# Patient Record
Sex: Male | Born: 1985 | Hispanic: Yes | Marital: Single | Smoking: Never smoker
Health system: Southern US, Community
[De-identification: ages and names within clinical notes are randomized; demographics above are authoritative.]

## PROBLEM LIST (undated history)

## (undated) HISTORY — PX: NASAL SINUS SURGERY: SHX719

---

## 2009-03-15 ENCOUNTER — Emergency Department: Payer: Self-pay | Admitting: Emergency Medicine

## 2012-01-21 ENCOUNTER — Ambulatory Visit: Payer: Self-pay | Admitting: Otolaryngology

## 2013-10-19 ENCOUNTER — Ambulatory Visit: Payer: Self-pay | Admitting: Otolaryngology

## 2014-05-17 ENCOUNTER — Emergency Department
Admit: 2014-05-17 | Disposition: A | Discharge status: Home / Self Care | Payer: Self-pay | Admitting: Emergency Medicine

## 2015-02-26 DIAGNOSIS — M545 Low back pain: Secondary | ICD-10-CM | POA: Diagnosis not present

## 2015-02-26 DIAGNOSIS — K5900 Constipation, unspecified: Secondary | ICD-10-CM | POA: Diagnosis not present

## 2015-02-26 DIAGNOSIS — Z880 Allergy status to penicillin: Secondary | ICD-10-CM | POA: Insufficient documentation

## 2015-02-27 ENCOUNTER — Emergency Department
Admission: EM | Admit: 2015-02-27 | Discharge: 2015-02-27 | Disposition: A | Discharge status: Home / Self Care | Payer: Commercial Managed Care - PPO | Attending: Emergency Medicine | Admitting: Emergency Medicine

## 2015-02-27 ENCOUNTER — Emergency Department: Payer: Commercial Managed Care - PPO

## 2015-02-27 ENCOUNTER — Encounter: Payer: Self-pay | Admitting: Emergency Medicine

## 2015-02-27 DIAGNOSIS — K5900 Constipation, unspecified: Secondary | ICD-10-CM

## 2015-02-27 DIAGNOSIS — M545 Low back pain, unspecified: Secondary | ICD-10-CM

## 2015-02-27 LAB — URINALYSIS COMPLETE WITH MICROSCOPIC (ARMC ONLY)
BILIRUBIN URINE: NEGATIVE
Bacteria, UA: NONE SEEN
GLUCOSE, UA: NEGATIVE mg/dL
Hgb urine dipstick: NEGATIVE
KETONES UR: NEGATIVE mg/dL
Leukocytes, UA: NEGATIVE
NITRITE: NEGATIVE
Protein, ur: NEGATIVE mg/dL
SPECIFIC GRAVITY, URINE: 1.012 (ref 1.005–1.030)
Squamous Epithelial / LPF: NONE SEEN
pH: 6 (ref 5.0–8.0)

## 2015-02-27 MED ORDER — NAPROXEN 500 MG PO TABS
ORAL_TABLET | ORAL | Status: AC
  Administered 2015-02-27: 500 mg via ORAL
  Filled 2015-02-27: qty 1

## 2015-02-27 MED ORDER — DOCUSATE SODIUM 100 MG PO CAPS: 200.0000 mg | ORAL_CAPSULE | Freq: Two times a day (BID) | ORAL | Status: AC

## 2015-02-27 MED ORDER — SENNA 8.6 MG PO TABS: 2.0000 | ORAL_TABLET | Freq: Two times a day (BID) | ORAL | Status: AC

## 2015-02-27 MED ORDER — POLYETHYLENE GLYCOL 3350 17 GM/SCOOP PO POWD: ORAL | Status: AC

## 2015-02-27 MED ORDER — NAPROXEN 500 MG PO TABS
500.0000 mg | ORAL_TABLET | Freq: Once | ORAL | Status: AC
  Administered 2015-02-27: 500 mg via ORAL

## 2015-02-27 NOTE — Discharge Instructions (Signed)
Dolor de espalda en adultos °(Back Pain, Adult) °El dolor de espalda es muy frecuente en los adultos. La causa del dolor de espalda es rara vez peligrosa y el dolor a menudo mejora con el tiempo. Es posible que se desconozca la causa de esta afección. Algunas causas comunes son las siguientes: °· Distensión de los músculos o ligamentos que sostienen la columna vertebral. °· Desgaste (degeneración) de los discos vertebrales. °· Artritis. °· Lesiones directas en la espalda. °En muchas personas, el dolor de espalda es recurrente. Como rara vez es peligroso, las personas pueden aprender a manejar esta afección por sí mismas. °INSTRUCCIONES PARA EL CUIDADO EN EL HOGAR °Controle su dolor de espalda a fin de detectar algún cambio. Las siguientes indicaciones ayudarán a aliviar cualquier molestia que pueda sentir: °· Permanezca activo. Si permanece sentado o de pie en un mismo lugar durante mucho tiempo, se tensiona la espalda. No se siente, conduzca o permanezca de pie en un mismo lugar durante más de 30 minutos seguidos. Realice caminatas cortas en superficies planas tan pronto como le sea posible. Trate de caminar un poco más de tiempo cada día. °· Haga ejercicio regularmente como se lo haya indicado el médico. El ejercicio ayuda a que su espalda se cure más rápidamente. También ayuda a prevenir futuras lesiones al mantener los músculos fuertes y flexibles. °· No permanezca en la cama. Si hace reposo más de 1 a 2 días, puede demorar su recuperación. °· Preste atención a su cuerpo al inclinarse y levantarse. Las posiciones más cómodas son las que ejercen menos tensión en la espalda en recuperación. Siempre use técnicas apropiadas para levantar objetos, como por ejemplo: °· Flexionar las rodillas. °· Mantener la carga cerca del cuerpo. °· No torcerse. °· Encuentre una posición cómoda para dormir. Use un colchón firme y recuéstese de costado con las rodillas ligeramente flexionadas. Si se recuesta sobre la espalda, coloque  una almohada debajo de las rodillas. °· Evite sentir ansiedad o estrés. El estrés aumenta la tensión muscular y puede empeorar el dolor de espalda. Es importante reconocer si se siente ansioso o estresado y aprender maneras de controlarlo, por ejemplo haciendo ejercicio. °· Tome los medicamentos solamente como se lo haya indicado el médico. Los medicamentos de venta libre para aliviar el dolor y la inflamación a menudo son los más eficaces. El médico puede recetarle relajantes musculares. Estos medicamentos ayudan a calmar el dolor de modo que pueda reanudar más rápidamente sus actividades normales y el ejercicio saludable. °· Aplique hielo sobre la zona lesionada. °· Ponga el hielo en una bolsa plástica. °· Coloque una toalla entre la piel y la bolsa de hielo. °· Deje el hielo durante 20 minutos, 2 a 3 veces por día, durante los primeros 2 o 3 días. Después de eso, puede alternar el hielo y el calor para reducir el dolor y los espasmos. °· Mantenga un peso saludable. El exceso de peso ejerce presión adicional sobre la espalda y hace que resulte difícil mantener una buena postura. °SOLICITE ATENCIÓN MÉDICA SI: °· Siente un dolor que no se alivia con reposo o medicamentos. °· Siente mucho dolor que se extiende a las piernas o los glúteos. °· El dolor no mejora en una semana. °· Siente dolor por la noche. °· Pierde peso. °· Siente escalofríos o fiebre. °SOLICITE ATENCIÓN MÉDICA DE INMEDIATO SI:  °· Tiene nuevos problemas para controlar la vejiga o los intestinos. °· Siente debilidad o adormecimiento inusuales en los brazos o en las piernas. °· Siente náuseas o vómitos. °· Siente dolor abdominal. °· Siente que va a desmayarse. °  °  Esta informacin no tiene Theme park managercomo fin reemplazar el consejo del mdico. Asegrese de hacerle al mdico cualquier pregunta que tenga.   Document Released: 01/28/2005 Document Revised: 02/18/2014 Elsevier Interactive Patient Education 2016 [employer]  Estreimiento -  Adultos (Constipation, Adult) Estreimiento significa que una persona tiene menos de tres evacuaciones en una semana, dificultad para defecar, o que las heces son secas, duras, o ms grandes que lo normal. A medida que envejecemos el estreimiento es ms comn. Una dieta baja en fibra, no tomar suficientes lquidos y el uso de ciertos medicamentos pueden Scientist, research (life sciences)empeorar el estreimiento.  CAUSAS   Ciertos medicamentos, como los antidepresivos, analgsicos, suplementos de hierro, anticidos y diurticos.  Algunas enfermedades, como la diabetes, el sndrome del colon irritable, enfermedad de la tiroides, o depresin.  No beber suficiente agua.  No consumir suficientes alimentos ricos en fibra.  Situaciones de estrs o viajes.  Falta de actividad fsica o de ejercicio.  Ignorar la necesidad sbita de Advertising copywriterdefecar.  Uso en exceso de laxantes. SIGNOS Y SNTOMAS   Defecar menos de tres veces por semana.  Dificultad para defecar.  Tener las heces secas y duras, o ms grandes que las normales.  Sensacin de estar lleno o hinchado.  Dolor en la parte baja del abdomen.  No sentir alivio despus de defecar. DIAGNSTICO  El mdico le har una historia clnica y un examen fsico. Pueden hacerle exmenes adicionales para el estreimiento grave. Estos estudios pueden ser:  Un radiografa con enema de bario para examinar el recto, el colon y, en algunos casos, el intestino delgado.  Una sigmoidoscopia para examinar el colon inferior.  Una colonoscopia para examinar todo el colon. TRATAMIENTO  El tratamiento depender de la gravedad del estreimiento y de la causa. Algunos tratamientos nutricionales son beber ms lquidos y comer ms alimentos ricos en fibra. El cambio en el estilo de vida incluye hacer ejercicios de [place] regular. Si estas recomendaciones para Public relations account executiverealizar cambios en la dieta y en el estilo de vida no ayudan, el mdico le puede indicar el uso de laxantes de venta libre para ayudarlo a  Advertising copywriterdefecar. Los medicamentos recetados se pueden prescribir si los medicamentos de venta libre no lo [place]udan.  INSTRUCCIONES PARA EL CUIDADO EN EL HOGAR   Consuma alimentos con alto contenido de Preaknessfibra, como frutas, vegetales, cereales integrales y porotos.  Limite los alimentos procesados ricos en grasas y azcar, como las papas fritas, hamburguesas, galletas, dulces y refrescos.  Puede agregar un suplemento de fibra a su dieta si no obtiene lo suficiente de los alimentos.  Beba suficiente lquido para Photographermantener la orina clara o de color amarillo plido.  Haga ejercicio regularmente o segn las indicaciones del mdico.  Vaya al bao cuando sienta la necesidad de ir. No se aguante las ganas.  Tome solo medicamentos de venta libre o recetados, segn las indicaciones del mdico. No tome otros medicamentos para el estreimiento sin consultarlo antes con su mdico. SOLICITE ATENCIN MDICA DE INMEDIATO SI:   Observa sangre brillante en las heces.  El estreimiento dura ms de 4 das o [place] Bendempeora.  Siente dolor abdominal o rectal.  Las heces son delgadas como un lpiz.  Pierde peso de [place]era inexplicable. ASEGRESE DE QUE:   Comprende estas instrucciones.  Controlar su afeccin.  Recibir ayuda de inmediato si no mejora o si empeora.   Esta informacin no tiene Theme park managercomo fin reemplazar el consejo del mdico. Asegrese de hacerle al mdico cualquier pregunta que tenga.   Document Released: 02/17/2007 Document Revised: 02/18/2014 Elsevier Interactive  Patient Education 2016 [employer].

## 2015-02-27 NOTE — ED Notes (Addendum)
Pt reports lower back pain x 2wks; denies any accomp symptoms; st pain increases with any movement; denies any specific injury

## 2015-02-27 NOTE — ED Provider Notes (Signed)
[facility] Emergency Department Provider Note  ____________________________________________  Time seen: 12:40 AM  I have reviewed the triage vital signs and the nursing notes.   HISTORY  Chief Complaint Back Pain  Interviewed with Spanish interpreter at bedside  HPI Ernest MallickDario Quirazco Esteban is a 30 y.o. male who complains of bilateral lower back pain for 2 weeks. It is constant and steady, not waxing and waning. It's worse with movement. No injuries or trauma. No pain like this before. No urinary symptoms. No fever or vomiting or any other complaints. Started gradually 2 weeks ago. Dull and achy. Nonradiating. No other associated symptoms. Feels better when he lies on his stomach. No aggravating symptoms.     History reviewed. No pertinent past medical history.   There are no active problems to display for this patient.    Past Surgical History  Procedure Laterality Date  . Nasal sinus surgery       Current Outpatient Rx  Name  Route  Sig  Dispense  Refill  . docusate sodium (COLACE) 100 MG capsule   Oral   Take 2 capsules (200 mg total) by mouth 2 (two) times daily.   120 capsule   0   . polyethylene glycol powder (GLYCOLAX/MIRALAX) powder      2 cap fulls in a full glass of water, three times a day, for 5 days.   255 g   0   . senna (SENOKOT) 8.6 MG TABS tablet   Oral   Take 2 tablets (17.2 mg total) by mouth 2 (two) times daily.   120 each   0      Allergies Penicillins   No family history on file.  Social History Social History  Substance Use Topics  . Smoking status: Never Smoker   . Smokeless tobacco: None  . Alcohol Use: No    Review of Systems  Constitutional:   No fever or chills. No weight changes Eyes:   No blurry vision or double vision.  ENT:   No sore throat. Cardiovascular:   No chest pain. Respiratory:   No dyspnea or cough. Gastrointestinal:   Negative for abdominal pain, vomiting and diarrhea.  No  BRBPR or melena. Genitourinary:   Negative for dysuria, urinary retention, bloody urine, or difficulty urinating. Musculoskeletal:   Positive backache pain as above.  Skin:   Negative for rash. Neurological:   Negative for headaches, focal weakness or numbness. Psychiatric:  No anxiety or depression.   Endocrine:  No hot/cold intolerance, changes in energy, or sleep difficulty.  10-point ROS otherwise negative.  ____________________________________________   PHYSICAL EXAM:  VITAL SIGNS: ED Triage Vitals  Enc Vitals Group     BP 02/27/15 0009 159/100 mmHg     Pulse Rate 02/27/15 0009 78     Resp 02/27/15 0009 20     Temp 02/27/15 0009 97.6 F (36.4 C)     Temp Source 02/27/15 0009 Oral     SpO2 02/27/15 0009 100 %     Weight 02/27/15 0009 184 lb (83.462 kg)     Height 02/27/15 0009 5\' 3"  (1.6 m)     Head Cir --      Peak Flow --      Pain Score 02/27/15 0008 8     Pain Loc --      Pain Edu? --      Excl. in GC? --     Vital signs reviewed, nursing assessments reviewed.   Constitutional:   Alert and  oriented. Well appearing and in no distress. Eyes:   No scleral icterus. No conjunctival pallor. PERRL. EOMI ENT   Head:   Normocephalic and atraumatic.   Nose:   No congestion/rhinnorhea. No septal hematoma   Mouth/Throat:   MMM, no pharyngeal erythema. No peritonsillar mass. No uvula shift.   Neck:   No stridor. No SubQ emphysema. No meningismus. Hematological/Lymphatic/Immunilogical:   No cervical lymphadenopathy. Cardiovascular:   RRR. Normal and symmetric distal pulses are present in all extremities. No murmurs, rubs, or gallops. Respiratory:   Normal respiratory effort without tachypnea nor retractions. Breath sounds are clear and equal bilaterally. No wheezes/rales/rhonchi. Gastrointestinal:   Soft and nontender. No distention. There is no CVA tenderness.  No rebound, rigidity, or guarding.  Genitourinary:   deferred Musculoskeletal:   Nontender with  normal range of motion in all extremities. No joint effusions.  No lower extremity tenderness.  No edema. Straight leg raise negative bilaterally Neurologic:   Normal speech and language.  CN 2-10 normal. Motor grossly intact. No pronator drift.  Normal gait. No gross focal neurologic deficits are appreciated.  Skin:    Skin is warm, dry and intact. No rash noted.  No petechiae, purpura, or bullae. Psychiatric:   Mood and affect are normal. Speech and behavior are normal. Patient exhibits appropriate insight and judgment.  ____________________________________________    LABS (pertinent positives/negatives) (all labs ordered are listed, but only abnormal results are displayed) Labs Reviewed  URINALYSIS COMPLETEWITH MICROSCOPIC ([facility] ONLY) - Abnormal; Notable for the following:    Color, Urine STRAW (*)    APPearance CLEAR (*)    All other components within normal limits   ____________________________________________   EKG    ____________________________________________    RADIOLOGY  KUB interpreted by me, radiology report reviewed, unremarkable, does show a large amount of bowel gas and stool  ____________________________________________   PROCEDURES   ____________________________________________   INITIAL IMPRESSION / ASSESSMENT AND PLAN / ED COURSE  Pertinent labs & imaging results that were available during my care of the patient were reviewed by me and considered in my medical decision making (see chart for details).  Patient presents with bilateral lower back pain for 2 weeks. No discernible features of the history of present illness other than that it does appear to be worse with movement and is highly likely to be musculoskeletal in origin. We'll check a urinalysis and KUB.    ----------------------------------------- 2:25 AM on 02/27/2015 -----------------------------------------  Urinalysis completely clean. No evidence of stone or hydronephrosis on  KUB. Does suggest the patient has some element of constipation or gas pains. We'll try a GI regimen and have him follow up with primary care. No evidence of spinal epidural abscess or hematoma, fracture or any serious intra-abdominal pathology.Considering the patient's symptoms, medical history, and physical examination today, I have low suspicion for cholecystitis or biliary pathology, pancreatitis, perforation or bowel obstruction, hernia, intra-abdominal abscess, AAA or dissection, volvulus or intussusception, or appendicitis.       ____________________________________________   FINAL CLINICAL IMPRESSION(S) / ED DIAGNOSES  Final diagnoses:  Bilateral low back pain without sciatica  Constipation, unspecified constipation type      Sharman Cheek, MD 02/27/15 [phone]

## 2015-02-27 NOTE — ED Notes (Signed)
Patient with no complaints at this time. Respirations even and unlabored. Skin warm/dry. Discharge instructions reviewed with patient at this time. Patient given opportunity to voice concerns/ask questions. Patient discharged at this time and left Emergency Department with steady gait.   

## 2020-06-13 ENCOUNTER — Emergency Department
Admission: EM | Admit: 2020-06-13 | Discharge: 2020-06-13 | Disposition: A | Discharge status: Home / Self Care | Payer: No Typology Code available for payment source | Attending: Emergency Medicine | Admitting: Emergency Medicine

## 2020-06-13 ENCOUNTER — Emergency Department: Payer: No Typology Code available for payment source

## 2020-06-13 ENCOUNTER — Other Ambulatory Visit: Payer: Self-pay

## 2020-06-13 DIAGNOSIS — M542 Cervicalgia: Secondary | ICD-10-CM | POA: Insufficient documentation

## 2020-06-13 DIAGNOSIS — M25561 Pain in right knee: Secondary | ICD-10-CM | POA: Insufficient documentation

## 2020-06-13 DIAGNOSIS — Y92410 Unspecified street and highway as the place of occurrence of the external cause: Secondary | ICD-10-CM | POA: Diagnosis not present

## 2020-06-13 DIAGNOSIS — M25511 Pain in right shoulder: Secondary | ICD-10-CM | POA: Diagnosis not present

## 2020-06-13 DIAGNOSIS — R519 Headache, unspecified: Secondary | ICD-10-CM | POA: Insufficient documentation

## 2020-06-13 MED ORDER — METHOCARBAMOL 750 MG PO TABS: 750.0000 mg | ORAL_TABLET | Freq: Four times a day (QID) | ORAL | 0 refills | Status: AC | PRN

## 2020-06-13 MED ORDER — METHOCARBAMOL 500 MG PO TABS
750.0000 mg | ORAL_TABLET | Freq: Once | ORAL | Status: AC
  Administered 2020-06-13: 750 mg via ORAL
  Filled 2020-06-13: qty 2

## 2020-06-13 MED ORDER — MELOXICAM 7.5 MG PO TABS
15.0000 mg | ORAL_TABLET | Freq: Once | ORAL | Status: AC
  Administered 2020-06-13: 15 mg via ORAL
  Filled 2020-06-13: qty 2

## 2020-06-13 MED ORDER — ACETAMINOPHEN 325 MG PO TABS
650.0000 mg | ORAL_TABLET | Freq: Once | ORAL | Status: AC
  Administered 2020-06-13: 650 mg via ORAL
  Filled 2020-06-13: qty 2

## 2020-06-13 MED ORDER — MELOXICAM 15 MG PO TABS: 15.0000 mg | ORAL_TABLET | Freq: Every day | ORAL | 0 refills | Status: AC
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
## 2020-06-13 NOTE — Discharge Instructions (Signed)
Please take the prescribed medication. You may also take Tylenol, up to 1000mg  4x daily. Please follow up with orthopedics regarding your knee.

## 2020-06-13 NOTE — ED Provider Notes (Incomplete)
[facility] Emergency Department Provider Note {** REMINDER - THIS NOTE IS NOT A FINAL MEDICAL RECORD UNTIL IT IS SIGNED. UNTIL THEN, THE CONTENT BELOW MAY REFLECT INFORMATION FROM A DOCUMENTATION TEMPLATE, NOT THE ACTUAL PATIENT VISIT. **} ____________________________________________   Event Date/Time   First MD Initiated Contact with Patient 06/13/20 1821     (approximate)  I have reviewed the triage vital signs and the nursing notes.   HISTORY  Chief Complaint Pension scheme manager {** Mother/father, caregiver, patient, etc **}  {**Delete this block, or insert here any limitations to your history or physical exam, such as chronic dementia, altered mental status, severe respiratory distress, intoxication, etc.**}  HPI Alfonsa Vaile is a 35 y.o. male ***  {**SYMPTOM/COMPLAINT LOCATION(describe anatomically) DURATION(when did it start) TIMING(onset and pattern) SEVERITY(0-10, mild/moderate/severe) QUALITY(description of symptoms) CONTEXT(recent surgery, new meds, activity, etc.) MODIFYINGFACTORS(what makes it better/worse) ASSOCIATEDSYMPTOMS(pertinent positives and negatives)**} History reviewed. No pertinent past medical history.  {** Birth history if appropriate **} Immunizations up to date:  {yes no:314532}  There are no problems to display for this patient.   Past Surgical History:  Procedure Laterality Date  . NASAL SINUS SURGERY      Prior to Admission medications   Medication Sig Start Date End Date Taking? Authorizing Provider  docusate sodium (COLACE) 100 MG capsule Take 2 capsules (200 mg total) by mouth 2 (two) times daily. 02/27/15   Sharman Cheek, MD  polyethylene glycol powder [facility]) powder 2 cap fulls in a full glass of water, three times a day, for 5 days. 02/27/15   Sharman Cheek, MD  senna (SENOKOT) 8.6 MG TABS tablet Take 2 tablets (17.2 mg total) by mouth 2 (two) times daily.  02/27/15   Sharman Cheek, MD    Allergies Penicillins  No family history on file.  Social History Social History   Tobacco Use  . Smoking status: Never Smoker  Substance Use Topics  . Alcohol use: No    Review of Systems {** Revise as appropriate then delete this line - Documentation of 10 systems is required **}Constitutional: No fever.  Baseline level of activity. Eyes: No visual changes.  No red eyes/discharge. ENT: No sore throat.  Not pulling at ears. Cardiovascular: Negative for chest pain/palpitations. Respiratory: Negative for shortness of breath. Gastrointestinal: No abdominal pain.  No nausea, no vomiting.  No diarrhea.  No constipation. Genitourinary: Negative for dysuria.  Normal urination. Musculoskeletal: Negative for back pain. Skin: Negative for rash. Neurological: Negative for headaches, focal weakness or numbness. {**Psychiatric: Endocrine: Hematological/Lymphatic: Allergic/Immunological: **}   ____________________________________________   PHYSICAL EXAM:  VITAL SIGNS: ED Triage Vitals  Enc Vitals Group     BP 06/13/20 1703 (!) 145/96     Pulse Rate 06/13/20 1703 65     Resp 06/13/20 1703 18     Temp 06/13/20 1703 98 F (36.7 C)     Temp Source 06/13/20 1703 Oral     SpO2 06/13/20 1703 97 %     Weight 06/13/20 1811 184 lb 1.4 oz (83.5 kg)     Height 06/13/20 1811 5\' 3"  (1.6 m)     Head Circumference --      Peak Flow --      Pain Score 06/13/20 1701 5     Pain Loc --      Pain Edu? --      Excl. in GC? --    {** Revise as appropriate then delete this line - 8 systems required **}  Constitutional: Alert, attentive, and oriented appropriately for age. Well appearing and in no acute distress. {** For infants, consider adding a comment about consolability, normal feeding, flat fontanelle, muscle tone  **} Eyes: Conjunctivae are normal. PERRL. EOMI. Head: Atraumatic and normocephalic. Nose: No congestion/rhinorrhea. Mouth/Throat:  Mucous membranes are moist.  Oropharynx non-erythematous. Neck: No stridor.  {**No cervical spine tenderness to palpation.**} {**Hematological/Lymphatic/Immunological: No cervical lymphadenopathy. **}Cardiovascular: Normal rate, regular rhythm. Grossly normal heart sounds.  Good peripheral circulation with normal cap refill. Respiratory: Normal respiratory effort.  No retractions. Lungs CTAB with no W/R/R. Gastrointestinal: Soft and nontender. No distention. {**Genitourinary:  **}Musculoskeletal: Non-tender with normal range of motion in all extremities.  No joint effusions.  Weight-bearing without difficulty. Neurologic:  Appropriate for age. No gross focal neurologic deficits are appreciated.  No gait instability.  {** Speech is normal.  **} Skin:  Skin is warm, dry and intact. No rash noted. {** Psychiatric: Mood and affect are normal. Speech and behavior are normal. **}  ____________________________________________   LABS (all labs ordered are listed, but only abnormal results are displayed)  Labs Reviewed - No data to display ____________________________________________  {**EKG  *** ____________________________________________  **}RADIOLOGY  {** Add your own interpretation of the imaging results here **} ____________________________________________   PROCEDURES  Procedure(s) performed: {Name/None:19197::"***, see procedure note(s).","None"}  Procedures   Critical Care performed: {CriticalCareYesNo:19197::"Yes, see critical care note(s)","No"}  ____________________________________________   INITIAL IMPRESSION / ASSESSMENT AND PLAN / ED COURSE  As part of my medical decision making, I reviewed the following data within the electronic MEDICAL RECORD NUMBER {Mdm:[id number]::"Notes from prior ED visits","[place] Controlled Substance Database"}   ***      ____________________________________________   FINAL CLINICAL IMPRESSION(S) / ED DIAGNOSES  Final diagnoses:  None      ED Discharge Orders    None      Note:  This document was prepared using Dragon voice recognition software and may include unintentional dictation errors.
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
